# Patient Record
Sex: Female | Born: 1980 | Race: White | Hispanic: No | Marital: Married | Smoking: Former smoker
Health system: Southern US, Community
[De-identification: ages and names within clinical notes are randomized; demographics above are authoritative.]

## PROBLEM LIST (undated history)

## (undated) DIAGNOSIS — E039 Hypothyroidism, unspecified: Secondary | ICD-10-CM

## (undated) DIAGNOSIS — I10 Essential (primary) hypertension: Secondary | ICD-10-CM

## (undated) HISTORY — DX: Essential (primary) hypertension: I10

## (undated) HISTORY — DX: Hypothyroidism, unspecified: E03.9

---

## 2005-07-08 ENCOUNTER — Other Ambulatory Visit: Admission: RE | Admit: 2005-07-08 | Discharge: 2005-07-08 | Payer: Self-pay | Admitting: Obstetrics and Gynecology

## 2005-07-09 ENCOUNTER — Other Ambulatory Visit: Admission: RE | Admit: 2005-07-09 | Discharge: 2005-07-09 | Payer: Self-pay | Admitting: Obstetrics and Gynecology

## 2007-05-29 ENCOUNTER — Inpatient Hospital Stay (HOSPITAL_COMMUNITY): Admission: AD | Admit: 2007-05-29 | Discharge: 2007-05-29 | Payer: Self-pay | Admitting: Obstetrics and Gynecology

## 2007-12-23 ENCOUNTER — Inpatient Hospital Stay (HOSPITAL_COMMUNITY): Admission: AD | Admit: 2007-12-23 | Discharge: 2007-12-28 | Payer: Self-pay | Admitting: Obstetrics and Gynecology

## 2011-03-05 NOTE — Op Note (Signed)
[name], Malone NO.:  [id number]   MEDICAL RECORD NO.:  [id number]          PATIENT TYPE:  INP   LOCATION:  9173                          FACILITY:  [facility]   PHYSICIAN:  Gerrit Friends. Aldona Bar, M.D.   DATE OF BIRTH:  1981/02/16   DATE OF PROCEDURE:  12/25/2007  DATE OF DISCHARGE:                               OPERATIVE REPORT   The patient is age 30.   PREOPERATIVE DIAGNOSES:  1. Term pregnancy.  2. Failed induction for pruritic urticarial papules and plaques.   POSTOPERATIVE DIAGNOSES:  1. Term pregnancy.  2. Failed induction for pruritic urticarial papules and plaques.  3. Delivery of 8 pound 4 ounce female infant, Apgars 6 and 9.   PROCEDURE:  Primary low transverse cesarean section.   SURGEON:  Dr. Aldona Bar.   ANESTHESIA:  Epidural.   HISTORY:  This 30 year old primigravid patient was admitted on the  evening of March 4 for induction secondary to PUPPS.  She was admitted  with an unfavorable cervix.  During the day on March 5, she received  Pitocin and had minimal progression but ultimately at approximately 2:00  p.m. had her membranes ruptured and meconium stained amniotic fluid was  noted.  At that time, her cervix was 1 cm dilated.  By the evening of  March 5, she had progressed to 2 cm of dilatation.  When I arrived on  the morning of March 6, she was comfortable with her epidural but on  examination the cervix was at best 2 cm with a very thick anterior lip  and the vertex presenting at about -2 station.  A long discussion was  carried out with the patient and her husband and essentially the patient  was presented the option of continuing her trial of labor or going ahead  and doing a primary low transverse cesarean section for failed induction  which was my recommendation and she was taken to the operating room for  such procedure.   Once in the operating room her epidural was augmented and she received  preoperative Ancef IV.  Once the patient was  comfortable and after she  was prepped and draped, the procedure was begun.  A Foley catheter had  been inserted prior to her arrival in the operating suite.   A Pfannenstiel incision was made and with minimal difficulty dissected  down to and through the fascia in a low transverse fashion.  A  subfascial space was created inferiorly and superiorly, muscles  separated in the midline, the peritoneum identified and entered  appropriately with care taken to avoid the bowel superiorly and the  bladder inferiorly.  At this time, the vesicouterine peritoneum was  identified, incised in a low transverse fashion, pushed off the lower  uterine segment with ease and then a sharp incision into the uterus in  low-transverse fashion was then made with the Metzenbaum scissors and  extended laterally with the fingers.   At this time from a vertex position with the aid of the vacuum  extractor, a viable female infant weighing 8 pounds 4 ounces was  delivered from a  vertex position.  The infant cried after stimulation.  The infant was somewhat floppy and minimal blood was noted at this time  in the umbilical cord.   The umbilical cord was clamped and cut, the infant was passed off to the  awaiting team.  The subsequent weight was found to be 8 pounds 4 ounces.  Apgars were assigned at 6 and 9 and the baby ultimately was taken to the  nursery in good condition.   The placenta was delivered intact and passed off to the awaiting cord  blood team.  At this time, the uterus was exteriorized, rendered free of  any remaining products of conception and a good uterine contractility  was afforded but slowly given intravenous Pitocin and manual  stimulation.   Once good uterine contractility was afforded, closure of the uterine  incision was carried out using a single layer of #1 Vicryl in a running  locking fashion and this  was oversewn with several figure-of-eight #1  Vicryls.  The tubes and ovaries were  noted to be normal,  uterine  incision dry and the uterus was well contracted.  The abdomen at this  time was lavaged of all free blood and clot and replaced into the  abdominal cavity.  At this time all counts were noted to be correct and  no foreign bodies were noted to be remaining in the abdominal cavity.  At this time, closure of the abdomen was carried out in layers.  The  abdominal peritoneum was closed with #0 Vicryl in a running fashion and  muscle secured with same.  Assured of good subfascial hemostasis, the  fascia was then reapproximated using #0 Vicryl from angle to midline  bilaterally.  The subcutaneous tissues was rendered hemostatic.  Staples  were then used to close skin.  A sterile pressure was applied and the  patient at this time was transported to the recovery room in  satisfactory condition having tolerated the procedure well.  Estimated  blood loss 600 mL. All counts correct x2.   In summary, this patient was admitted for induction at term with a  somewhat unfavorable cervix and after 36 hours and a minimal  progression, the decision was made to proceed with a primary low  transverse cesarean section because of failed induction attempt.  Induction was carried out because of the patient's diagnosis of PUPPS.   At the conclusion of the procedure both mother and baby were doing well  in their respective recovery areas.      Gerrit Friends. Aldona Bar, M.D.  Electronically Signed     RMW/MEDQ  D:  12/25/2007  T:  12/26/2007  Job:  [id number]

## 2011-03-08 NOTE — Discharge Summary (Signed)
Carla Malone, Carla Malone               ACCOUNT NO.:  [id number]   MEDICAL RECORD NO.:  [id number]          PATIENT TYPE:  INP   LOCATION:  9101                          FACILITY:  [facility]   PHYSICIAN:  Kendra H. Tenny Craw, MD     DATE OF BIRTH:  [birth year]   DATE OF ADMISSION:  12/23/2007  DATE OF DISCHARGE:  12/28/2007                               DISCHARGE SUMMARY   FINAL DIAGNOSES:  1. Intrauterine pregnancy at 39-05/7 weeks' gestation.  2. Induction of labor for severe pruritic urticarial papules and      plaques of pregnancy (PUPPP), failed induction.   PROCEDURE:  Primary low-transverse cesarean section.   SURGEON:  Gerrit Friends. Aldona Bar, M.D.   COMPLICATIONS:  None.   HOSPITAL COURSE:  This 30 year old G1, P0 was admitted on December 23, 2007  for induction secondary to severe PUPPP.  She did have an unfavorable  cervix at this time.  The patient's antepartum course up to this point  had been uncomplicated until she developed this PUPPP rash which was  very uncomfortable.  The patient received Pitocin and had rupture of  membranes about 2 that afternoon.  Meconium stained fluid was noted.  The patient was about 1 cm dilated at this time.  By that evening, she  was only 2 cm with a very thick anterior lip.  A long discussion was  held with the patient and her husband and a decision at this point was  made to proceed with a cesarean section secondary to a failed induction.  The patient was taken to the operating room by Dr. Annamaria Helling on December 25, 2007, where a primary low-transverse cesarean section was performed  with the delivery of an 8 pound 4 ounce female infant with Apgars of 6  and 9.  The delivery went without complications.  The patient's  postoperative course was complicated by some postoperative anemia and  she was felt ready for discharge on postoperative day #3.  She was sent  home on a regular diet.  Told to decrease her activity, told to continue  her prenatal vitamin, and  hopefully an iron supplement, I do not see  that listed.  She was given Percocet 1-2 every 4-6 hours as needed for  her pain, was to use ibuprofen up to 600 mg every 6 hours as needed for  pain, was to follow up in our office in 4-6 weeks.  Instructions and  precautions were reviewed with the patient.   LABORATORY DATA:  Labs on discharge, she had a hemoglobin of 10.0, white  blood cell count of 8.7, and platelets of 132,000.      Leilani Able, P.A.-C.      Freddrick March. Tenny Craw, MD  Electronically Signed   MB/MEDQ  D:  01/13/2008  T:  01/14/2008  Job:  [id number]

## 2011-07-15 LAB — COMPREHENSIVE METABOLIC PANEL
ALT: 17
AST: 29
Albumin: 2 — ABNORMAL LOW
Alkaline Phosphatase: 90
Calcium: 8.2 — ABNORMAL LOW
GFR calc Af Amer: >60
Potassium: 3.6
Sodium: 136
Total Protein: 4.4 — ABNORMAL LOW

## 2011-07-15 LAB — CBC
HCT: 34.2 — ABNORMAL LOW
HCT: 39.7
Hemoglobin: 10 — ABNORMAL LOW
Hemoglobin: 11.8 — ABNORMAL LOW
MCHC: 34.4
MCHC: 34.5
MCHC: 35.2
MCV: 94.4
Platelets: 118 — ABNORMAL LOW
Platelets: 132 — ABNORMAL LOW
RBC: 4.21
RDW: 14.1
RDW: 14.4
WBC: 5.9

## 2011-07-15 LAB — DIFFERENTIAL
Basophils Relative: 0
Eosinophils Absolute: 0.2
Eosinophils Relative: 2
Lymphs Abs: 1.4
Monocytes Absolute: 0.6
Monocytes Relative: 7
Neutrophils Relative %: 75

## 2011-07-15 LAB — RPR: RPR Ser Ql: NONREACTIVE

## 2011-08-05 LAB — URINALYSIS, ROUTINE W REFLEX MICROSCOPIC
Bilirubin Urine: NEGATIVE
Glucose, UA: NEGATIVE
Hgb urine dipstick: NEGATIVE
Protein, ur: NEGATIVE

## 2018-09-24 ENCOUNTER — Encounter: Payer: Self-pay | Admitting: Family Medicine

## 2018-09-24 ENCOUNTER — Ambulatory Visit (INDEPENDENT_AMBULATORY_CARE_PROVIDER_SITE_OTHER): Payer: BC Managed Care – PPO | Admitting: Family Medicine

## 2018-09-24 VITALS — BP 142/76 | HR 100 | Ht 65.0 in | Wt 161.0 lb

## 2018-09-24 DIAGNOSIS — S29012A Strain of muscle and tendon of back wall of thorax, initial encounter: Secondary | ICD-10-CM | POA: Diagnosis not present

## 2018-09-24 DIAGNOSIS — E039 Hypothyroidism, unspecified: Secondary | ICD-10-CM

## 2018-09-24 DIAGNOSIS — L309 Dermatitis, unspecified: Secondary | ICD-10-CM | POA: Insufficient documentation

## 2018-09-24 DIAGNOSIS — I10 Essential (primary) hypertension: Secondary | ICD-10-CM

## 2018-09-24 DIAGNOSIS — F419 Anxiety disorder, unspecified: Secondary | ICD-10-CM | POA: Insufficient documentation

## 2018-09-24 HISTORY — DX: Essential (primary) hypertension: I10

## 2018-09-24 HISTORY — DX: Hypothyroidism, unspecified: E03.9

## 2018-09-24 MED ORDER — CYCLOBENZAPRINE HCL 10 MG PO TABS: 5.0000 mg | ORAL_TABLET | Freq: Three times a day (TID) | ORAL | 0 refills | Status: AC | PRN

## 2018-09-24 NOTE — Progress Notes (Signed)
Subjective:    CC: Right rhomboid pain.  HPI: Carla Malone developed pain in her right rhomboid region about 2 weeks ago without injury.  Pain is worse with upper arm motion.  She continues to experience pain however it is slowly improving.  She is tried Tylenol and ibuprofen heating pad icy hot.  Her cousin is a physical therapist and she received some informal physical therapy and some home exercise programs that have not helped much.  She notes that she has had a cold recently with some cough and congestion but without shortness of breath fevers or chills.  She notes that she will have occasional pain in her rhomboid region with deep inspiration or sneezing.  She feels well otherwise.  She is not had symptoms like this previously.  She denies any injury potential cause of this.  She is right-hand dominant.  Past medical history, Surgical history, Family history not pertinant except as noted below, Social history, Allergies, and medications have been entered into the medical record, reviewed, and no changes needed.   Review of Systems: No headache, visual changes, nausea, vomiting, diarrhea, constipation, dizziness, abdominal pain, skin rash, fevers, chills, night sweats, weight loss, swollen lymph nodes, body aches, joint swelling, muscle aches, chest pain, shortness of breath, mood changes, visual or auditory hallucinations.   Objective:    Vitals:   09/24/18 0803  BP: (!) 142/76  Pulse: 100   General: Well Developed, well nourished, and in no acute distress.  Neuro/Psych: Alert and oriented x3, extra-ocular muscles intact, able to move all 4 extremities, sensation grossly intact. Skin: Warm and dry, no rashes noted.  Respiratory: Not using accessory muscles, speaking in full sentences, trachea midline.  Cardiovascular: Pulses palpable, no extremity edema. Abdomen: Does not appear distended. MSK: C-spine: Nontender to midline normal neck motion.  Upper extremity strength is equal and  normal throughout.  Reflexes equal normal throughout bilateral upper extremities. Thoracic: Nontender to T-spine midline.  Tender palpation right rhomboid region and right thoracic paraspinal musculature. Right shoulder normal-appearing tender palpation rhomboid as described previously.  Normal shoulder motion pain with abduction and scapular retraction. No scapular winging. Pulses capillary fill and sensation intact bilateral upper extremities.    Impression and Recommendations:    Assessment and Plan: 37 y.o. female with right rhomboid pain.  Patient does have some symptoms with deep inspiration or sneezing however I think is very unlikely she has any intrathoracic cause of pain.  Offered chest x-ray patient declined at this time.  Will obtain chest x-ray if not better.  Discussed treatment plan including physical therapy, home exercise program, NSAIDs, cyclobenzaprine, and TENS unit.  Patient will continue with in formal physical therapy for now however if not improving she will attend formal physical therapy.  Written order provided.  Recheck 4 to 6 weeks if not improving.  Precautions discussed..   Orders Placed This Encounter  Procedures  . Ambulatory referral to Physical Therapy    Referral Priority:   Routine    Referral Type:   Physical Medicine    Referral Reason:   Specialty Services Required    Requested Specialty:   Physical Therapy    Number of Visits Requested:   1   Meds ordered this encounter  Medications  . cyclobenzaprine (FLEXERIL) 10 MG tablet    Sig: Take 0.5-1 tablets (5-10 mg total) by mouth 3 (three) times daily as needed for muscle spasms.    Dispense:  30 tablet    Refill:  0  Discussed warning signs or symptoms. Please see discharge instructions. Patient expresses understanding.

## 2018-09-24 NOTE — Patient Instructions (Signed)
Thank you for coming in today. Attend Physical therapy.  Do the home exercises.  Use heating pad and TENS unit.   Use muscle relexer up to 3x daily as needed.  Come back or go to the emergency room if you notice new weakness new numbness problems walking or bowel or bladder problems.  Recheck in 4-6 weeks if not better.    TENS UNIT: This is helpful for muscle pain and spasm.   Search and Purchase a TENS 7000 2nd edition at  www.tenspros.com or www.[employer].com It should be less than $30.     TENS unit instructions: Do not shower or bathe with the unit on Turn the unit off before removing electrodes or batteries If the electrodes lose stickiness add a drop of water to the electrodes after they are disconnected from the unit and place on plastic sheet. If you continued to have difficulty, call the TENS unit company to purchase more electrodes. Do not apply lotion on the skin area prior to use. Make sure the skin is clean and dry as this will help prolong the life of the electrodes. After use, always check skin for unusual red areas, rash or other skin difficulties. If there are any skin problems, does not apply electrodes to the same area. Never remove the electrodes from the unit by pulling the wires. Do not use the TENS unit or electrodes other than as directed. Do not change electrode placement without consultating your therapist or physician. Keep 2 fingers with between each electrode. Wear time ratio is 2:1, on to off times.    For example on for 30 minutes off for 15 minutes and then on for 30 minutes off for 15 minutes

## 2022-04-03 ENCOUNTER — Other Ambulatory Visit: Payer: Self-pay | Admitting: Obstetrics & Gynecology

## 2022-04-03 DIAGNOSIS — R928 Other abnormal and inconclusive findings on diagnostic imaging of breast: Secondary | ICD-10-CM

## 2022-04-11 ENCOUNTER — Ambulatory Visit
Admission: RE | Admit: 2022-04-11 | Discharge: 2022-04-11 | Disposition: A | Discharge status: Home / Self Care | Payer: BC Managed Care – PPO | Source: Ambulatory Visit | Attending: Obstetrics & Gynecology | Admitting: Obstetrics & Gynecology

## 2022-04-11 ENCOUNTER — Ambulatory Visit: Payer: BC Managed Care – PPO

## 2022-04-11 DIAGNOSIS — R928 Other abnormal and inconclusive findings on diagnostic imaging of breast: Secondary | ICD-10-CM

## 2022-11-05 IMAGING — MG MM DIGITAL DIAGNOSTIC UNILAT*R* W/ TOMO W/ CAD
4 series · 4 of 12 positions shown · non-contrast
Comparison: Previous exam(s).

CLINICAL DATA: Patient returns after screening study for evaluation
of possible RIGHT breast asymmetry.

EXAM:
DIGITAL DIAGNOSTIC UNILATERAL RIGHT MAMMOGRAM WITH TOMOSYNTHESIS AND
CAD
TECHNIQUE: Right digital diagnostic mammography and breast tomosynthesis was
performed. The images were evaluated with computer-aided detection.

[R CC synth-2D]
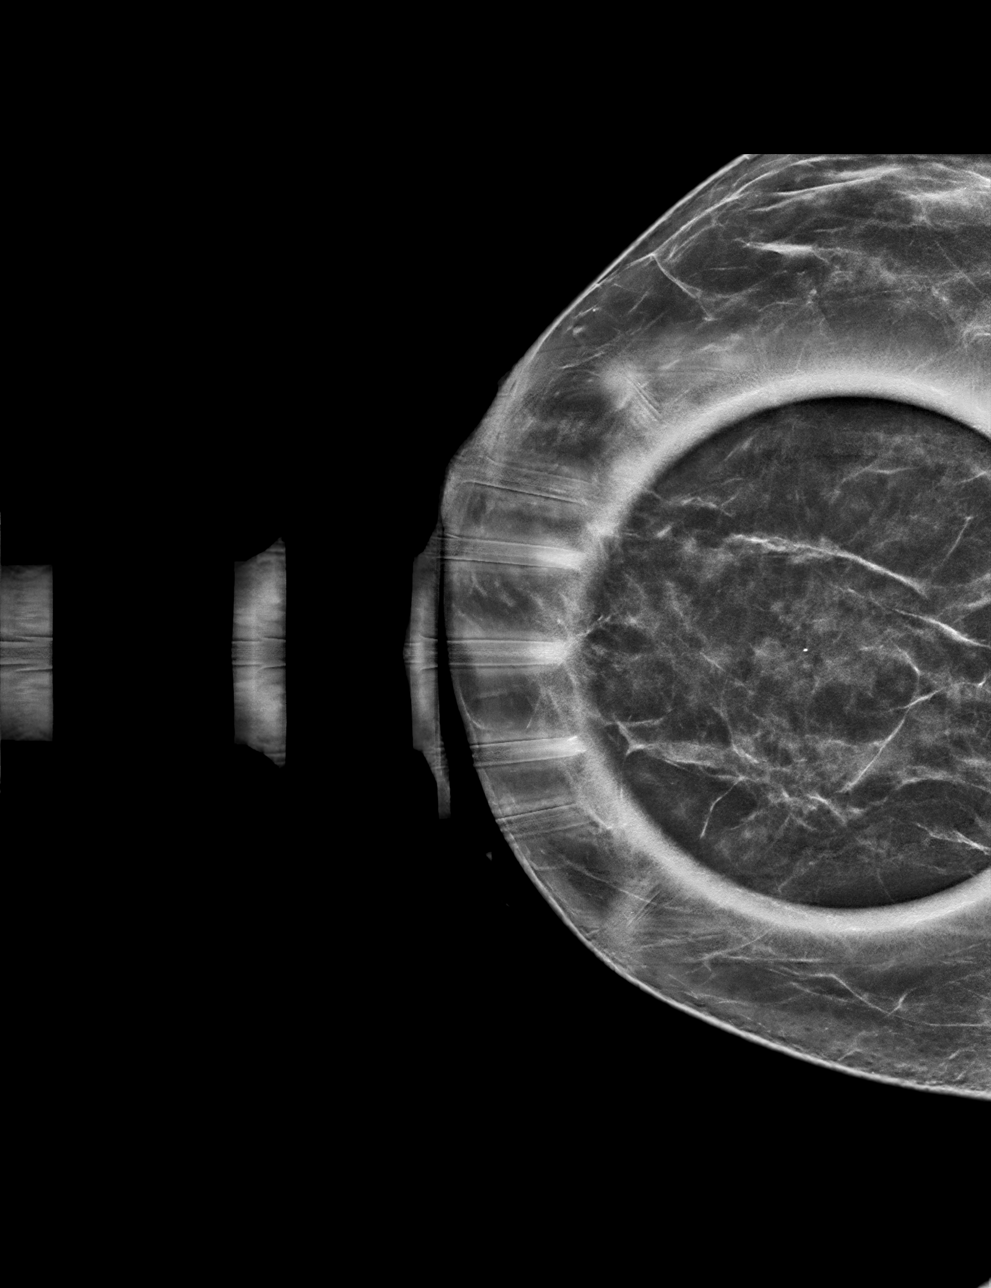

[R ML synth-2D]
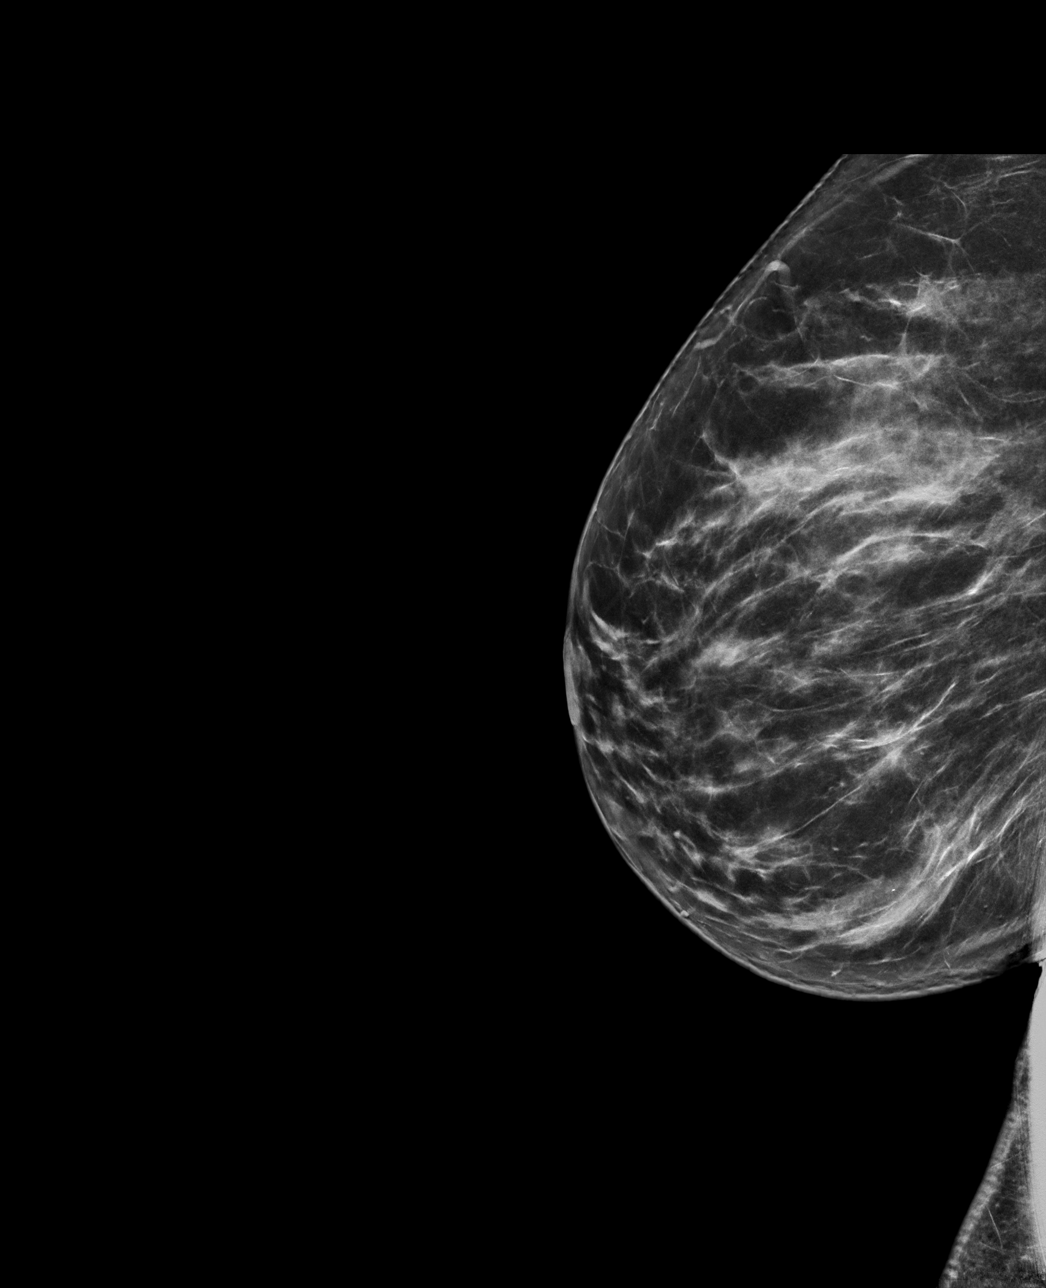

[R ML tomo · tomo slice 40/79.0]
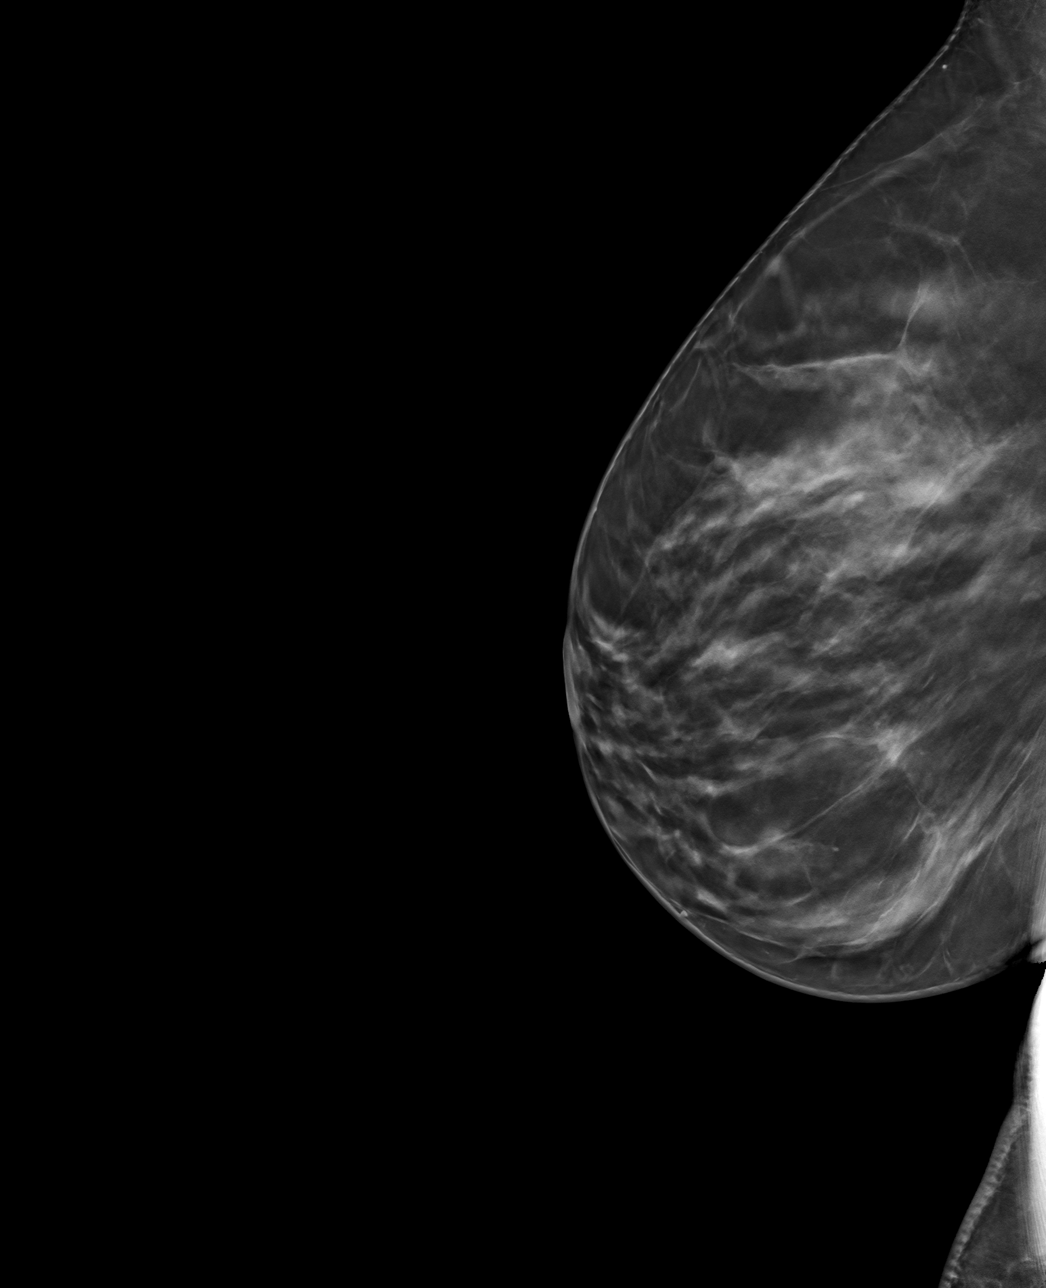

[R CC tomo · tomo slice 35/68.0]
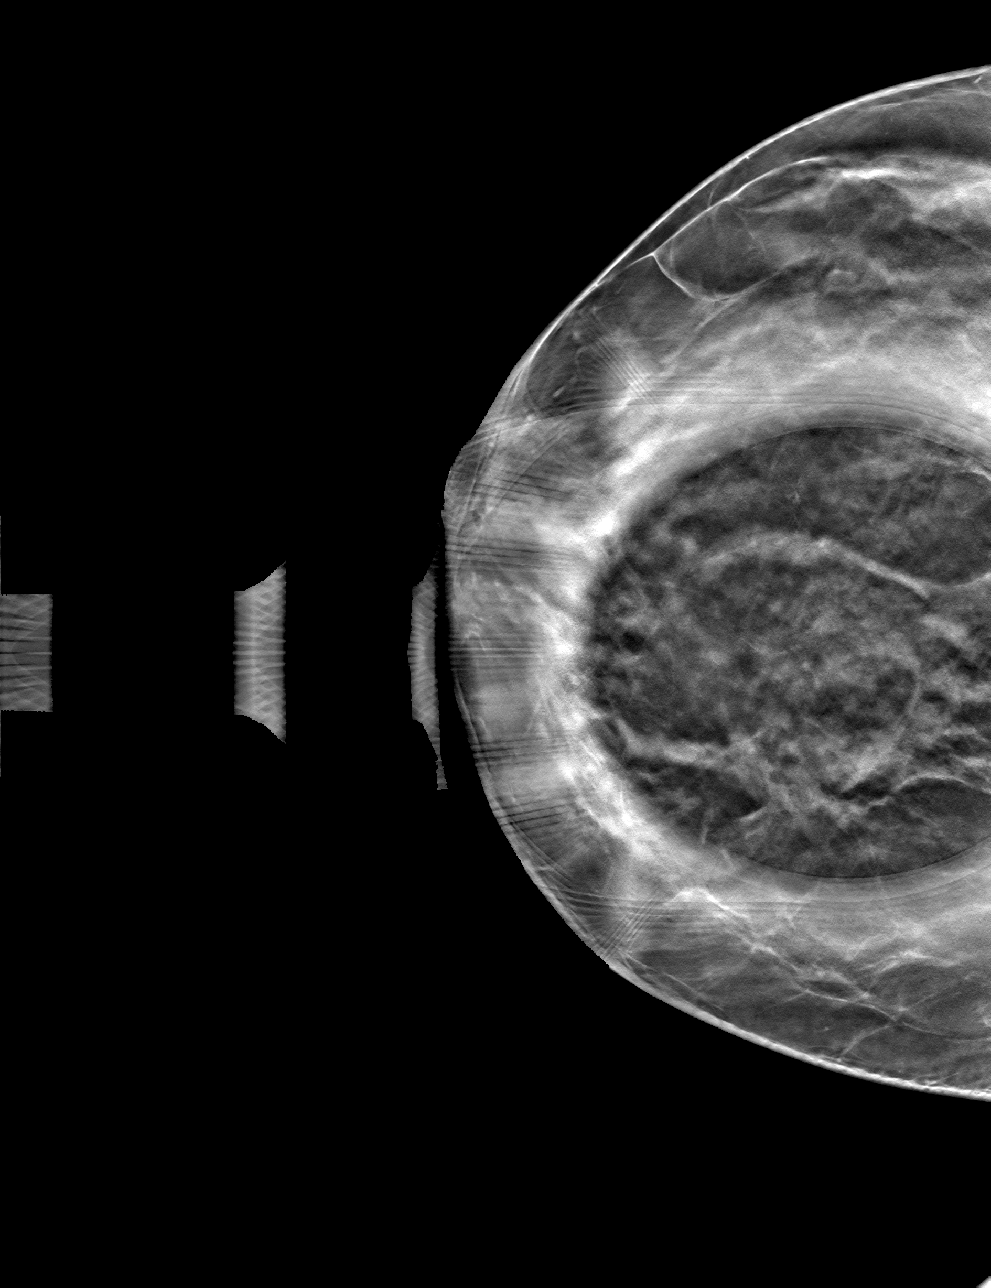

[4 of 12 positions shown; findings below may reference images not displayed]

ACR Breast Density Category c: The breast tissue is heterogeneously
dense, which may obscure small masses.
FINDINGS: Additional 2-D and 3-D images are performed. These views show no
persistent asymmetry MEDIAL aspect of the RIGHT breast. No
suspicious mass, distortion, or microcalcifications are identified
to suggest presence of malignancy.
IMPRESSION: No mammographic evidence for malignancy.

RECOMMENDATION:
Screening mammogram in one year.(Code:AS-6-ZSH)

I have discussed the findings and recommendations with the patient.
If applicable, a reminder letter will be sent to the patient
regarding the next appointment.

BI-RADS CATEGORY  1: Negative.
